# Patient Record
Sex: Male | Born: 1980 | Race: White | Hispanic: No | Marital: Single | Smoking: Current every day smoker
Health system: Southern US, Community
[De-identification: ages and names within clinical notes are randomized; demographics above are authoritative.]

## PROBLEM LIST (undated history)

## (undated) DIAGNOSIS — I10 Essential (primary) hypertension: Secondary | ICD-10-CM

## (undated) HISTORY — PX: HERNIA REPAIR: SHX51

---

## 2015-02-18 ENCOUNTER — Emergency Department (HOSPITAL_COMMUNITY): Payer: Self-pay

## 2015-02-18 ENCOUNTER — Encounter (HOSPITAL_COMMUNITY): Payer: Self-pay | Admitting: Emergency Medicine

## 2015-02-18 ENCOUNTER — Emergency Department (HOSPITAL_COMMUNITY)
Admission: EM | Admit: 2015-02-18 | Discharge: 2015-02-18 | Disposition: A | Discharge status: Home / Self Care | Payer: Self-pay | Attending: Emergency Medicine | Admitting: Emergency Medicine

## 2015-02-18 DIAGNOSIS — Z720 Tobacco use: Secondary | ICD-10-CM | POA: Insufficient documentation

## 2015-02-18 DIAGNOSIS — R1031 Right lower quadrant pain: Secondary | ICD-10-CM | POA: Insufficient documentation

## 2015-02-18 LAB — URINALYSIS, ROUTINE W REFLEX MICROSCOPIC
Bilirubin Urine: NEGATIVE
GLUCOSE, UA: NEGATIVE mg/dL
Hgb urine dipstick: NEGATIVE
Ketones, ur: NEGATIVE mg/dL
LEUKOCYTES UA: NEGATIVE
Nitrite: NEGATIVE
PH: 5.5 (ref 5.0–8.0)
Protein, ur: NEGATIVE mg/dL
Specific Gravity, Urine: 1.035 — ABNORMAL HIGH (ref 1.005–1.030)
UROBILINOGEN UA: 1 mg/dL (ref 0.0–1.0)

## 2015-02-18 LAB — I-STAT CHEM 8, ED
BUN: 16 mg/dL (ref 6–23)
CALCIUM ION: 1.15 mmol/L (ref 1.12–1.23)
CHLORIDE: 104 mmol/L (ref 96–112)
CREATININE: 0.9 mg/dL (ref 0.50–1.35)
Glucose, Bld: 115 mg/dL — ABNORMAL HIGH (ref 70–99)
HEMATOCRIT: 46 % (ref 39.0–52.0)
Hemoglobin: 15.6 g/dL (ref 13.0–17.0)
POTASSIUM: 3.6 mmol/L (ref 3.5–5.1)
Sodium: 140 mmol/L (ref 135–145)
TCO2: 20 mmol/L (ref 0–100)

## 2015-02-18 MED ORDER — HYDROCODONE-ACETAMINOPHEN 5-325 MG PO TABS
1.0000 | ORAL_TABLET | Freq: Four times a day (QID) | ORAL | Status: DC | PRN
Start: 2015-02-18 — End: 2015-06-28

## 2015-02-18 MED ORDER — NAPROXEN 500 MG PO TABS: 500.0000 mg | ORAL_TABLET | Freq: Two times a day (BID) | ORAL | Status: DC

## 2015-02-18 MED ORDER — IOHEXOL 300 MG/ML  SOLN
100.0000 mL | Freq: Once | INTRAMUSCULAR | Status: AC | PRN
  Administered 2015-02-18: 100 mL via INTRAVENOUS

## 2015-02-18 MED ORDER — MORPHINE SULFATE 4 MG/ML IJ SOLN
4.0000 mg | Freq: Once | INTRAMUSCULAR | Status: AC
  Administered 2015-02-18: 4 mg via INTRAVENOUS
  Filled 2015-02-18: qty 1

## 2015-02-18 MED ORDER — IOHEXOL 300 MG/ML  SOLN
25.0000 mL | Freq: Once | INTRAMUSCULAR | Status: AC | PRN
  Administered 2015-02-18: 25 mL via ORAL

## 2015-02-18 NOTE — ED Provider Notes (Signed)
CSN: [id number]     Arrival date & time 02/18/15  0801 History   First MD Initiated Contact with Patient 02/18/15 [phone]     Chief Complaint  Patient presents with  . Groin Pain     (Consider location/radiation/quality/duration/timing/severity/associated sxs/prior Treatment) Patient is a 34 y.o. male presenting with groin pain. The history is provided by the patient. No language interpreter was used.  Groin Pain This is a new problem. The current episode started 6 to 12 hours ago. The problem occurs constantly. The problem has not changed since onset.Pertinent negatives include no chest pain, no abdominal pain, no headaches and no shortness of breath. Exacerbated by: coughing, mvmt. Nothing relieves the symptoms. He has tried nothing for the symptoms. The treatment provided no relief.    History reviewed. No pertinent past medical history. Past Surgical History  Procedure Laterality Date  . Hernia repair     No family history on file. History  Substance Use Topics  . Smoking status: Current Every Day Smoker  . Smokeless tobacco: Not on file  . Alcohol Use: Yes    Review of Systems  Constitutional: Negative for fever, activity change, appetite change and fatigue.  HENT: Negative for congestion, facial swelling, rhinorrhea and trouble swallowing.   Eyes: Negative for photophobia and pain.  Respiratory: Negative for cough, chest tightness and shortness of breath.   Cardiovascular: Negative for chest pain and leg swelling.  Gastrointestinal: Negative for nausea, vomiting, abdominal pain, diarrhea and constipation.  Endocrine: Negative for polydipsia and polyuria.  Genitourinary: Negative for dysuria, urgency, decreased urine volume and difficulty urinating.  Musculoskeletal: Negative for back pain and gait problem.  Skin: Negative for color change, rash and wound.  Allergic/Immunologic: Negative for immunocompromised state.  Neurological: Negative for dizziness, facial asymmetry,  speech difficulty, weakness, numbness and headaches.  Psychiatric/Behavioral: Negative for confusion, decreased concentration and agitation.      Allergies  Review of patient's allergies indicates no known allergies.  Home Medications   Prior to Admission medications   Medication Sig Start Date End Date Taking? Authorizing Provider  ibuprofen (ADVIL,MOTRIN) 200 MG tablet Take 400 mg by mouth every 6 (six) hours as needed for fever, headache or mild pain.   Yes Historical Provider, MD  HYDROcodone-acetaminophen (NORCO) 5-325 MG per tablet Take 1 tablet by mouth every 6 (six) hours as needed. 02/18/15   Toy Cookey, MD  naproxen (NAPROSYN) 500 MG tablet Take 1 tablet (500 mg total) by mouth 2 (two) times daily with a meal. 02/18/15   Toy Cookey, MD   BP 134/88 mmHg  Pulse 76  Temp(Src) 97.5 F (36.4 C) (Oral)  Resp 18  SpO2 99% Physical Exam  Constitutional: He is oriented to person, place, and time. He appears well-developed and well-nourished. No distress.  HENT:  Head: Normocephalic and atraumatic.  Mouth/Throat: No oropharyngeal exudate.  Eyes: Pupils are equal, round, and reactive to light.  Neck: Normal range of motion. Neck supple.  Cardiovascular: Normal rate, regular rhythm and normal heart sounds.  Exam reveals no gallop and no friction rub.   No murmur heard. Pulmonary/Chest: Effort normal and breath sounds normal. No respiratory distress. He has no wheezes. He has no rales.  Abdominal: Soft. Bowel sounds are normal. He exhibits no distension and no mass. There is no tenderness. There is no rebound and no guarding. Hernia confirmed negative in the right inguinal area and confirmed negative in the left inguinal area.  Genitourinary:    Cremasteric reflex is present. Right testis shows  no mass, no swelling and no tenderness. Left testis shows tenderness (mild). Left testis shows no mass and no swelling.  Musculoskeletal: Normal range of motion. He exhibits no edema  or tenderness.  Lymphadenopathy:       Right: No inguinal adenopathy present.       Left: No inguinal adenopathy present.  Neurological: He is alert and oriented to person, place, and time.  Skin: Skin is warm and dry.  Psychiatric: He has a normal mood and affect.    ED Course  Procedures (including critical care time) Labs Review Labs Reviewed  URINALYSIS, ROUTINE W REFLEX MICROSCOPIC - Abnormal; Notable for the following:    Color, Urine AMBER (*)    Specific Gravity, Urine 1.035 (*)    All other components within normal limits  I-STAT CHEM 8, ED - Abnormal; Notable for the following:    Glucose, Bld 115 (*)    All other components within normal limits    Imaging Review Ct Abdomen Pelvis W Contrast  02/18/2015   CLINICAL DATA:  Left inguinal pain starting last ninth, states bilateral hernia repair 2015  EXAM: CT ABDOMEN AND PELVIS WITH CONTRAST  TECHNIQUE: Multidetector CT imaging of the abdomen and pelvis was performed using the standard protocol following bolus administration of intravenous contrast.  CONTRAST:  OMNIPAQUE IOHEXOL 300 MG/ML  SOLN  COMPARISON:  None.  FINDINGS: Lung bases are unremarkable. Sagittal images of the spine are unremarkable.  Enhanced liver is unremarkable. No calcified gallstones are noted within gallbladder. The pancreas, spleen and adrenal glands are unremarkable. Enhanced kidneys are symmetrical in size. There is a cyst in midpole of the right kidney measures 1.1 cm. Second cyst midpole of the right kidney measures 1.1 cm. Tiny cyst midpole of the left kidney measures 7 mm.  No aortic aneurysm. No small bowel obstruction. No ascites or free air. No adenopathy.  No pericecal inflammation. Normal retrocecal appendix. The terminal ileum is unremarkable. No thickened or dilated small bowel loops are noted. Tiny umbilical hernia containing fat without evidence of acute complication. There is a tiny right inguinal canal hernia containing fat measures 1.9 cm.  Small left inguinal canal hernia containing fat measures 2 cm. There is no evidence of acute complication. Borderline enlarged lymph node is noted in right inguinal region measures 1.5 x 1.2 cm.  IMPRESSION: 1. No acute inflammatory process within abdomen. 2. Normal appendix.  No pericecal inflammation. 3. No hydronephrosis or hydroureter.  Bilateral small renal cysts. 4. No small bowel obstruction. 5. Small amount of fat is noted bilateral inguinal canal without evidence of acute complication. No incarcerated hernia. Borderline enlarged right inguinal lymph node measures 1.4 x 1.2 cm.   Electronically Signed   By: Natasha Mead M.D.   On: 02/18/2015 10:34     EKG Interpretation None      MDM   Final diagnoses:  Right inguinal pain    Pt is a 34 y.o. male with Pmhx as above who presents with sudden onset L inguinal pain after coughing fit last night. On PE VSS, pt in NAD. He has reproducible pain over L inguinal area, but no palpable hernia. He has mild scrotal pain w/o swelling, nml lie, nml cremasteric.   UA normal.  CT abdomen pelvis with small amount of fat in the bilateral inguinal canal without acute complications incarcerated hernia, borderline enlarged right inguinal lymph node.  I suspect this intrusion of fat into the canals may be the cause of his sudden onset groin pain.  I will recommend outpatient follow-up with his surgeon.  I will give short course of narcotics and anti-inflammatories for pain.  I have recommended no heavy lifting for at least one week.   Dwyane DeeJeffrey Diesel evaluation in the Emergency Department is complete. It has been determined that no acute conditions requiring further emergency intervention are present at this time. The patient/guardian have been advised of the diagnosis and plan. We have discussed signs and symptoms that warrant return to the ED, such as changes or worsening in symptoms, worsening pain, fever, swelling that is not reducible.      Toy CookeyMegan  Docherty, MD 02/18/15 1114

## 2015-02-18 NOTE — ED Notes (Signed)
Pt reports hernia repair last year for bilateral inguinal hernias. Last night he coughed and since has had extreme pain to L groin.

## 2015-02-18 NOTE — Discharge Instructions (Signed)

## 2015-06-28 ENCOUNTER — Encounter (HOSPITAL_COMMUNITY): Payer: Self-pay | Admitting: *Deleted

## 2015-06-28 ENCOUNTER — Emergency Department (HOSPITAL_COMMUNITY)
Admission: EM | Admit: 2015-06-28 | Discharge: 2015-06-28 | Disposition: A | Discharge status: Home / Self Care | Payer: Self-pay | Attending: Emergency Medicine | Admitting: Emergency Medicine

## 2015-06-28 DIAGNOSIS — K4020 Bilateral inguinal hernia, without obstruction or gangrene, not specified as recurrent: Secondary | ICD-10-CM | POA: Insufficient documentation

## 2015-06-28 DIAGNOSIS — Z9889 Other specified postprocedural states: Secondary | ICD-10-CM | POA: Insufficient documentation

## 2015-06-28 DIAGNOSIS — Z720 Tobacco use: Secondary | ICD-10-CM | POA: Insufficient documentation

## 2015-06-28 DIAGNOSIS — Z791 Long term (current) use of non-steroidal anti-inflammatories (NSAID): Secondary | ICD-10-CM | POA: Insufficient documentation

## 2015-06-28 MED ORDER — HYDROCODONE-ACETAMINOPHEN 5-325 MG PO TABS
2.0000 | ORAL_TABLET | ORAL | Status: DC | PRN
Start: 2015-06-28 — End: 2015-07-27

## 2015-06-28 NOTE — ED Notes (Signed)
Patient reports having bilateral inguinal hernia repair x 1 year ago. Pain is back in both groins area, pain is constant. Patient reports heavy lifting as a part of his job.

## 2015-06-28 NOTE — ED Provider Notes (Signed)
CSN: [id number]     Arrival date & time 06/28/15  1758 History   First MD Initiated Contact with Patient 06/28/15 1931     Chief Complaint  Patient presents with  . Groin Pain      HPI  She presents I am relation of bilateral groin pain. Had bilateral hernia repairs done at [facility] over a year ago. He does a lot of heavy lifting at his job. 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 His been increasing left groin, and now right groin pain for the last several days. No frank bulge. No pain or symptoms to US scrotum.  History reviewed. No pertinent past medical history. Past Surgical History  Procedure Laterality Date  . Hernia repair     No family history on file. History  Substance Use Topics  . Smoking status: Current Every Day Smoker -- 1.00 packs/day    Types: Cigarettes  . Smokeless tobacco: Not on file  . Alcohol Use: Yes    Review of Systems  Constitutional: Negative for fever, chills, diaphoresis, appetite change and fatigue.  HENT: Negative for mouth sores, sore throat and trouble swallowing.   Eyes: Negative for visual disturbance.  Respiratory: Negative for cough, chest tightness, shortness of breath and wheezing.   Cardiovascular: Negative for chest pain.  Gastrointestinal: Negative for nausea, vomiting, abdominal pain, diarrhea and abdominal distention.  Endocrine: Negative for polydipsia, polyphagia and polyuria.  Genitourinary: Negative for dysuria, frequency and hematuria.       Groin pain  Musculoskeletal: Negative for gait problem.  Skin: Negative for color change, pallor and rash.  Neurological: Negative for dizziness, syncope, light-headedness and headaches.  Hematological: Does not bruise/bleed easily.  Psychiatric/Behavioral: Negative for behavioral problems and confusion.      Allergies  Review of patient's allergies indicates no known allergies.  Home Medications   Prior to  Admission medications   Medication Sig Start Date End Date Taking? Authorizing Provider  HYDROcodone-acetaminophen (NORCO/VICODIN) 5-325 MG per tablet Take 2 tablets by mouth every 4 (four) hours as needed. 06/28/15   Rolland Porter, MD  ibuprofen (ADVIL,MOTRIN) 200 MG tablet Take 400 mg by mouth every 6 (six) hours as needed for fever, headache or mild pain.    Historical Provider, MD  naproxen (NAPROSYN) 500 MG tablet Take 1 tablet (500 mg total) by mouth 2 (two) times daily with a meal. 02/18/15   Toy Cookey, MD   BP 162/97 mmHg  Pulse 84  Temp(Src) 98.2 F (36.8 C) (Oral)  Resp 20  Ht  (1.981 m)  Wt 194 lb 7 oz (88.196 kg)  BMI 22.47 kg/m2  SpO2 96% Physical Exam  Constitutional: He is oriented to person, place, and time. He appears well-developed and well-nourished. No distress.  HENT:  Head: Normocephalic.  Eyes: Conjunctivae are normal. Pupils are equal, round, and reactive to light. No scleral icterus.  Neck: Normal range of motion. Neck supple. No thyromegaly present.  Cardiovascular: Normal rate and regular rhythm.  Exam reveals no gallop and no friction rub.   No murmur heard. Pulmonary/Chest: Effort normal and breath sounds normal. No respiratory distress. He has no wheezes. He has no rales.  Abdominal: Soft. Bowel sounds are normal. He exhibits no distension. There is no tenderness. There is no rebound.    Musculoskeletal: Normal range of motion.  Neurological: He is alert and oriented to person, place, and time.  Skin: Skin is warm and dry. No rash noted.  Psychiatric: He has a normal mood and affect. His behavior is normal.    ED Course  Procedures (including critical care time) Labs Review Labs Reviewed - No data to display  Imaging Review No results found.   EKG Interpretation None      MDM   Final diagnoses:  Bilateral inguinal hernia without obstruction or gangrene, recurrence not specified    Patient with palpable defect left inguinal region.  Recent CT in June shows fatty recurrence of his inguinal hernias bilaterally. Left greater than right. Avoid repetitive motion heavy lifting. Surgical referral.    Rolland PorterMark Brittnei Jagiello, MD 06/28/15 2212

## 2015-06-28 NOTE — Discharge Instructions (Signed)

## 2015-07-26 ENCOUNTER — Encounter (HOSPITAL_COMMUNITY): Payer: Self-pay | Admitting: Emergency Medicine

## 2015-07-26 ENCOUNTER — Emergency Department (HOSPITAL_COMMUNITY): Payer: Self-pay

## 2015-07-26 ENCOUNTER — Emergency Department (HOSPITAL_COMMUNITY)
Admission: EM | Admit: 2015-07-26 | Discharge: 2015-07-27 | Disposition: A | Discharge status: Home / Self Care | Payer: Self-pay | Attending: Emergency Medicine | Admitting: Emergency Medicine

## 2015-07-26 DIAGNOSIS — R339 Retention of urine, unspecified: Secondary | ICD-10-CM | POA: Insufficient documentation

## 2015-07-26 DIAGNOSIS — K5900 Constipation, unspecified: Secondary | ICD-10-CM | POA: Insufficient documentation

## 2015-07-26 DIAGNOSIS — R109 Unspecified abdominal pain: Secondary | ICD-10-CM | POA: Insufficient documentation

## 2015-07-26 DIAGNOSIS — I10 Essential (primary) hypertension: Secondary | ICD-10-CM | POA: Insufficient documentation

## 2015-07-26 DIAGNOSIS — Z720 Tobacco use: Secondary | ICD-10-CM | POA: Insufficient documentation

## 2015-07-26 DIAGNOSIS — R52 Pain, unspecified: Secondary | ICD-10-CM

## 2015-07-26 HISTORY — DX: Essential (primary) hypertension: I10

## 2015-07-26 LAB — COMPREHENSIVE METABOLIC PANEL
ALT: 21 U/L (ref 17–63)
ANION GAP: 10 (ref 5–15)
AST: 23 U/L (ref 15–41)
Albumin: 4.3 g/dL (ref 3.5–5.0)
Alkaline Phosphatase: 66 U/L (ref 38–126)
BILIRUBIN TOTAL: 0.5 mg/dL (ref 0.3–1.2)
BUN: 13 mg/dL (ref 6–20)
CHLORIDE: 105 mmol/L (ref 101–111)
CO2: 24 mmol/L (ref 22–32)
Calcium: 8.7 mg/dL — ABNORMAL LOW (ref 8.9–10.3)
Creatinine, Ser: 0.95 mg/dL (ref 0.61–1.24)
Glucose, Bld: 92 mg/dL (ref 65–99)
POTASSIUM: 3.3 mmol/L — AB (ref 3.5–5.1)
Sodium: 139 mmol/L (ref 135–145)
TOTAL PROTEIN: 7.3 g/dL (ref 6.5–8.1)

## 2015-07-26 LAB — CBC WITH DIFFERENTIAL/PLATELET
BASOS ABS: 0 10*3/uL (ref 0.0–0.1)
Basophils Relative: 0 % (ref 0–1)
EOS PCT: 5 % (ref 0–5)
Eosinophils Absolute: 0.4 10*3/uL (ref 0.0–0.7)
HCT: 43.2 % (ref 39.0–52.0)
Hemoglobin: 14.7 g/dL (ref 13.0–17.0)
Lymphocytes Relative: 35 % (ref 12–46)
Lymphs Abs: 2.8 10*3/uL (ref 0.7–4.0)
MCH: 30.4 pg (ref 26.0–34.0)
MCHC: 34 g/dL (ref 30.0–36.0)
MCV: 89.3 fL (ref 78.0–100.0)
MONOS PCT: 8 % (ref 3–12)
Monocytes Absolute: 0.6 10*3/uL (ref 0.1–1.0)
Neutro Abs: 4.1 10*3/uL (ref 1.7–7.7)
Neutrophils Relative %: 52 % (ref 43–77)
PLATELETS: 306 10*3/uL (ref 150–400)
RBC: 4.84 MIL/uL (ref 4.22–5.81)
RDW: 13.7 % (ref 11.5–15.5)
WBC: 8 10*3/uL (ref 4.0–10.5)

## 2015-07-26 LAB — URINALYSIS, ROUTINE W REFLEX MICROSCOPIC
Bilirubin Urine: NEGATIVE
GLUCOSE, UA: NEGATIVE mg/dL
Hgb urine dipstick: NEGATIVE
KETONES UR: NEGATIVE mg/dL
Leukocytes, UA: NEGATIVE
NITRITE: NEGATIVE
PROTEIN: NEGATIVE mg/dL
Specific Gravity, Urine: 1.025 (ref 1.005–1.030)
Urobilinogen, UA: 0.2 mg/dL (ref 0.0–1.0)
pH: 6 (ref 5.0–8.0)

## 2015-07-26 NOTE — ED Provider Notes (Signed)
CSN: [id number]     Arrival date & time 07/26/15  1718 History  This chart was scribed for Nelva Nay, MD by Budd Palmer, ED Scribe. This patient was seen in room APA11/AP[facility]11 and the patient's care was started at 8:30 PM.     Chief Complaint  Patient presents with  . Abdominal Pain   The history is provided by the patient. No language interpreter was used.   HPI Comments: Jeff Miller is a 34 y.o. male with a PSHx of bilateral inguinal hernia repair who presents to the Emergency Department complaining of constant, worsening lower abdominal pain onset 7 days ago. He reports associated urinary retention and constipation. He was seen for the same at the ED 3 weeks ago where he was told to "take it easy" and take prescribed pain medication as needed. He reports he has been doing just that, but 7 days ago the pain began to worsen and his other symptoms began. His hernia repair surgery was over a year ago, performed by Dr Claudia Desanctis. He has consulted with a surgeon in [place] since the pain began again 3 weeks ago, and reports the surgeon will be able to see him in September, but he has not yet made an appointment.  Past Medical History  Diagnosis Date  . Hypertension    Past Surgical History  Procedure Laterality Date  . Hernia repair     No family history on file. Social History  Substance Use Topics  . Smoking status: Current Every Day Smoker -- 1.00 packs/day    Types: Cigarettes  . Smokeless tobacco: None  . Alcohol Use: Yes     Comment: rare    Review of Systems  Gastrointestinal: Positive for abdominal pain and constipation.  Genitourinary: Positive for decreased urine volume.  All other systems reviewed and are negative.   Allergies  Review of patient's allergies indicates no known allergies.  Home Medications   Prior to Admission medications   Medication Sig Start Date End Date Taking? Authorizing Provider  ibuprofen (ADVIL,MOTRIN) 200 MG tablet Take 400 mg by mouth  every 6 (six) hours as needed for fever, headache or mild pain.   Yes Historical Provider, MD  polyethylene glycol (MIRALAX) packet Take 17 g by mouth daily. 07/27/15   Nelva Nay, MD   BP 141/93 mmHg  Pulse 64  Temp(Src) 97.5 F (36.4 C) (Oral)  Resp 18  Ht 5' 6.5" (1.689 m)  Wt 194 lb (87.998 kg)  BMI 30.85 kg/m2  SpO2 100% Physical Exam  Constitutional: He is oriented to person, place, and time. He appears well-developed and well-nourished. No distress.  HENT:  Head: Normocephalic and atraumatic.  Eyes: Pupils are equal, round, and reactive to light.  Neck: Normal range of motion.  Cardiovascular: Normal rate and intact distal pulses.   Pulmonary/Chest: No respiratory distress.  Abdominal: Normal appearance. He exhibits no distension. There is no tenderness. No hernia. Hernia confirmed negative in the right inguinal area and confirmed negative in the left inguinal area.  Musculoskeletal: Normal range of motion.  Neurological: He is alert and oriented to person, place, and time. No cranial nerve deficit.  Skin: Skin is warm and dry. No rash noted.  Psychiatric: He has a normal mood and affect. His behavior is normal.  Nursing note and vitals reviewed.   ED Course  Procedures  DIAGNOSTIC STUDIES: Oxygen Saturation is 99% on RA, normal by my interpretation.    COORDINATION OF CARE: 8:34 PM - Discussed plans to order diagnostic imaging.  Pt advised of plan for treatment and pt agrees.  Labs Review Labs Reviewed  COMPREHENSIVE METABOLIC PANEL - Abnormal; Notable for the following:    Potassium 3.3 (*)    Calcium 8.7 (*)    All other components within normal limits  CBC WITH DIFFERENTIAL/PLATELET  URINALYSIS, ROUTINE W REFLEX MICROSCOPIC (NOT AT [facility])    Imaging Review No results found. I, Haskel Dewalt L, personally reviewed and evaluated these images and lab results as part of my medical decision-making. CT RENAL STONE STUDY (Final result) Result time: 07/26/15  23:18:30   Final result by Rad Results In Interface (07/26/15 23:18:30)   Narrative:   CLINICAL DATA: Acute onset of worsening lower abdominal pain and urinary retention. Constipation. Initial encounter.  EXAM: CT ABDOMEN AND PELVIS WITHOUT CONTRAST  TECHNIQUE: Multidetector CT imaging of the abdomen and pelvis was performed following the standard protocol without IV contrast.  COMPARISON: CT of the abdomen and pelvis performed 02/18/2015  FINDINGS: The visualized lung bases are clear.  The liver and spleen are unremarkable in appearance. The gallbladder is within normal limits. The pancreas and adrenal glands are unremarkable.  The kidneys are unremarkable in appearance. There is no evidence of hydronephrosis. No renal or ureteral stones are seen. No perinephric stranding is appreciated.  No free fluid is identified. The small bowel is unremarkable in appearance. The stomach is within normal limits. No acute vascular abnormalities are seen. Minimal calcification is noted along the abdominal aorta.  The appendix is normal in caliber and contains air, without evidence of appendicitis. The colon is unremarkable in appearance.  The bladder is mildly distended and grossly unremarkable. The prostate remains normal in size. No inguinal lymphadenopathy is seen.  No acute osseous abnormalities are identified.  IMPRESSION: Unremarkable noncontrast CT of the abdomen and pelvis.       MDM   Final diagnoses:  Pain   I personally performed the services described in this documentation, which was scribed in my presence. The recorded information has been reviewed and considered.   Nelva Nay, MD 08/07/15 [phone]

## 2015-07-26 NOTE — ED Notes (Signed)
Pt c/o lower abd pain with constipation x 7 days. Pt states he has reoccurring inguinal hernias.

## 2015-07-27 MED ORDER — HYDROMORPHONE HCL 1 MG/ML IJ SOLN: 1.0000 mg | Freq: Once | INTRAMUSCULAR | Status: DC

## 2015-07-27 MED ORDER — POLYETHYLENE GLYCOL 3350 17 G PO PACK: 17.0000 g | PACK | Freq: Every day | ORAL | Status: AC

## 2015-07-27 MED ORDER — HYDROMORPHONE HCL 1 MG/ML IJ SOLN
1.0000 mg | Freq: Once | INTRAMUSCULAR | Status: AC
  Administered 2015-07-27: 1 mg via INTRAVENOUS
  Filled 2015-07-27: qty 1

## 2015-07-27 NOTE — Discharge Instructions (Signed)

## 2016-03-26 IMAGING — CT CT ABD-PELV W/ CM
2 of 4 series · 16 of 46 positions shown, 18 images · IV contrast (APPLIED)
Comparison: None.

CLINICAL DATA: Left inguinal pain starting last ninth, states
bilateral hernia repair 5486

EXAM:
CT ABDOMEN AND PELVIS WITH CONTRAST
TECHNIQUE: Multidetector CT imaging of the abdomen and pelvis was performed
using the standard protocol following bolus administration of
intravenous contrast.
CONTRAST:  100mL OMNIPAQUE IOHEXOL 300 MG/ML  SOLN

[Series 2: abd/ pelvis 5.0 i30f 1 · axial · 0.86mm/px · z∈[+699,+1189]mm · 13 of 108 slices shown, 15 images]
[im 5/108  soft-tissue]
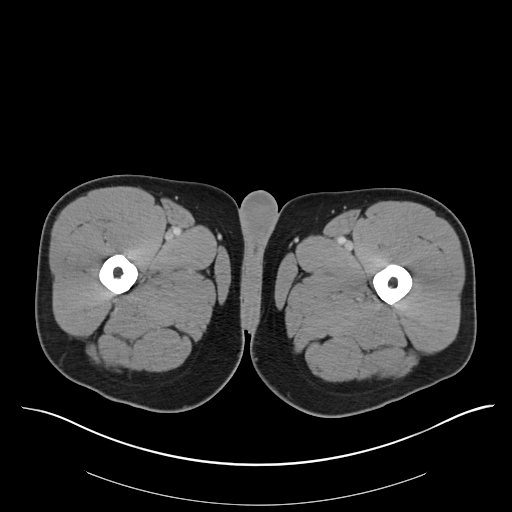
[im 5/108  bone]
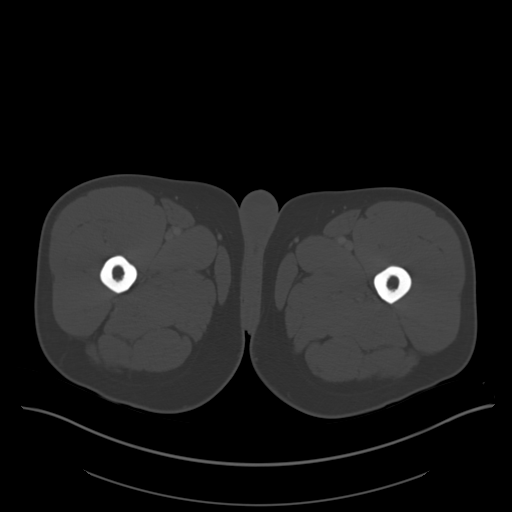
[im 14/108  soft-tissue]
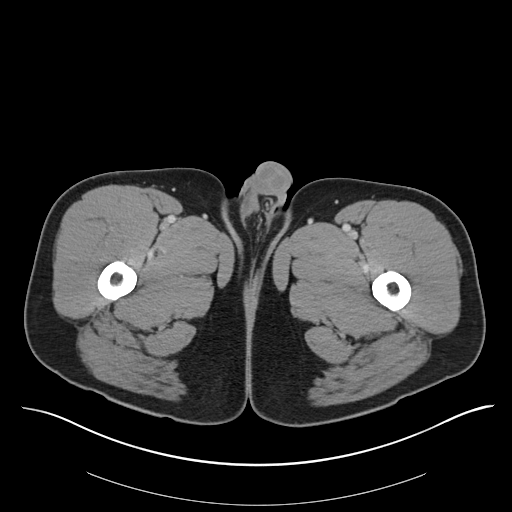
[im 23/108  soft-tissue]
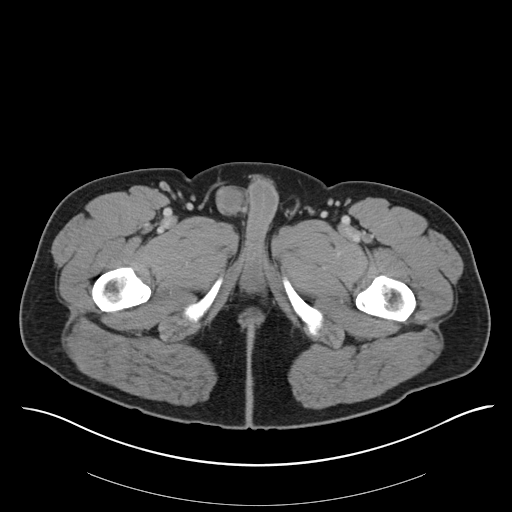
[im 32/108  soft-tissue]
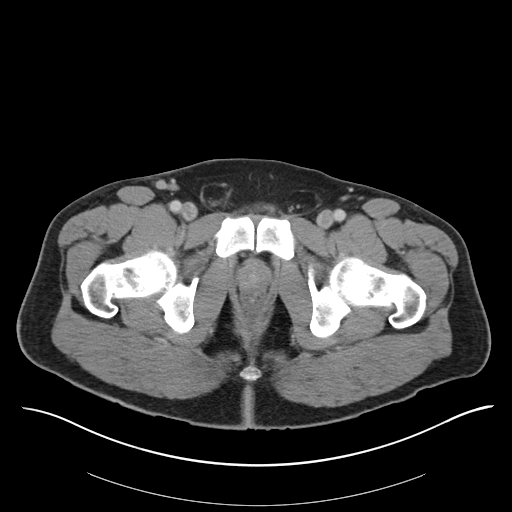
[im 36/108  soft-tissue]
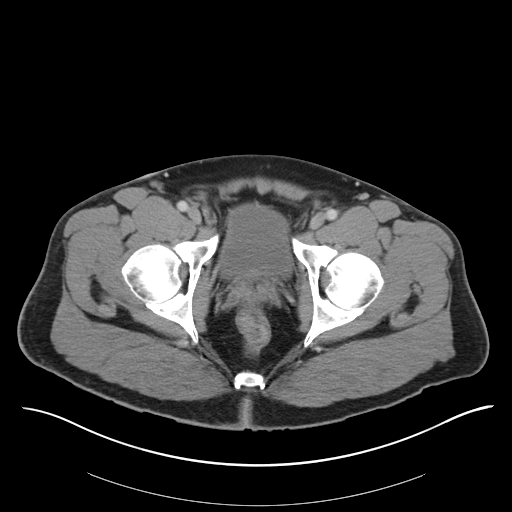
[im 45/108  soft-tissue]
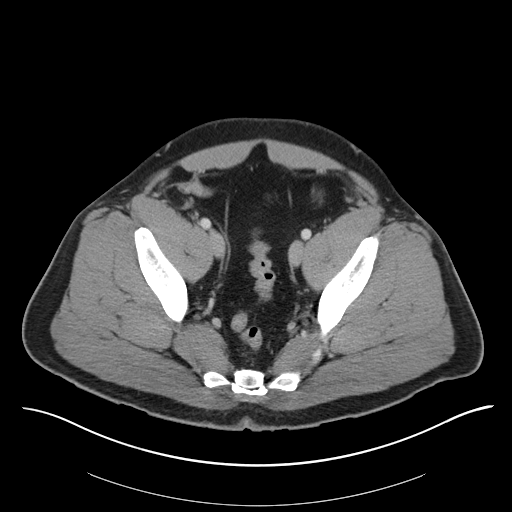
[im 54/108  soft-tissue]
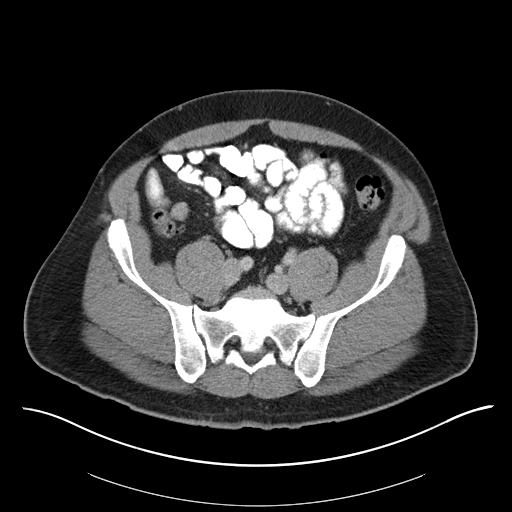
[im 63/108  soft-tissue]
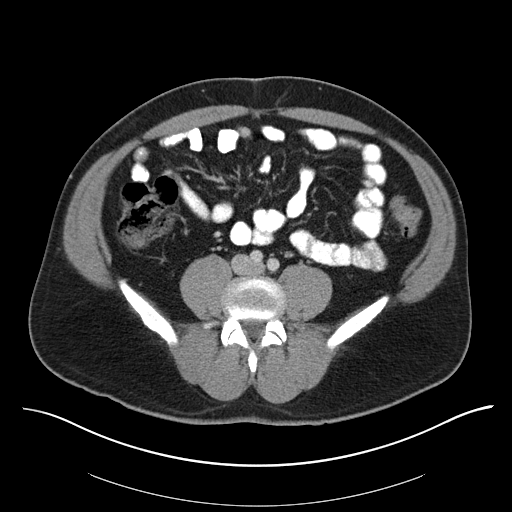
[im 72/108  soft-tissue]
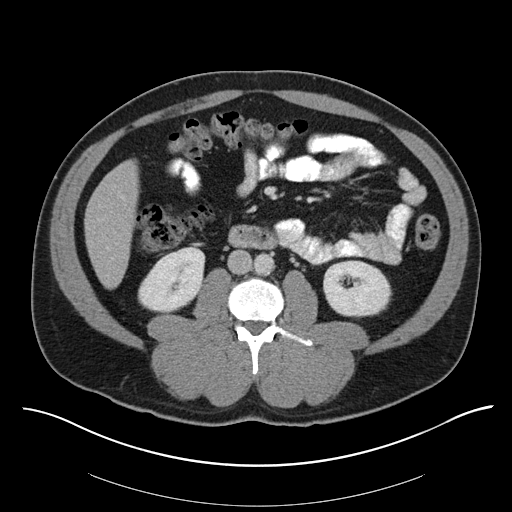
[im 72/108  bone]
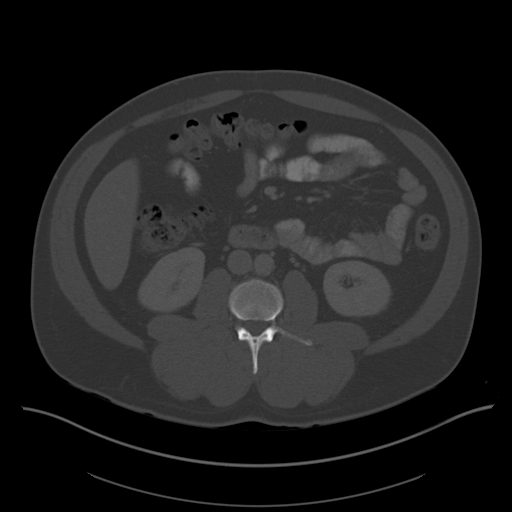
[im 76/108  soft-tissue]
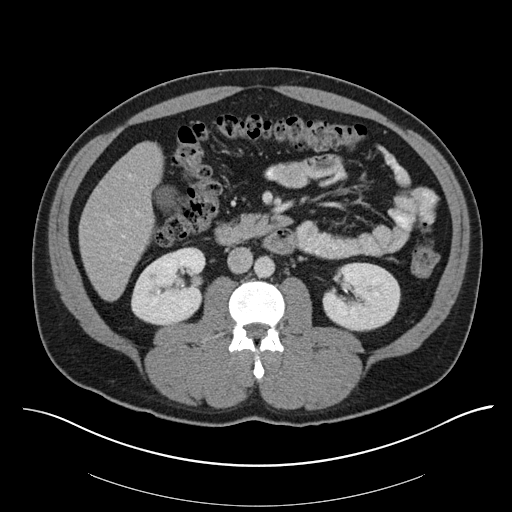
[im 85/108  soft-tissue]
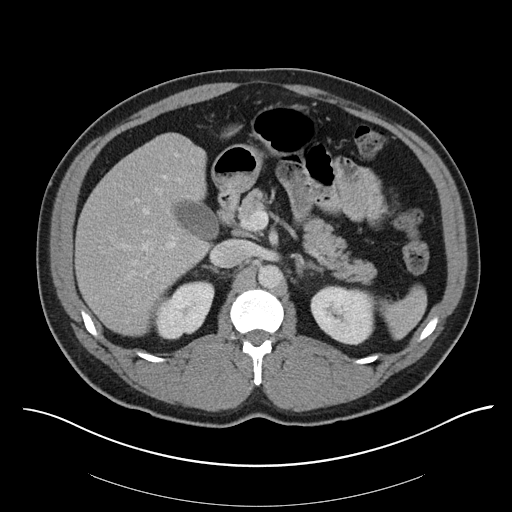
[im 94/108  soft-tissue]
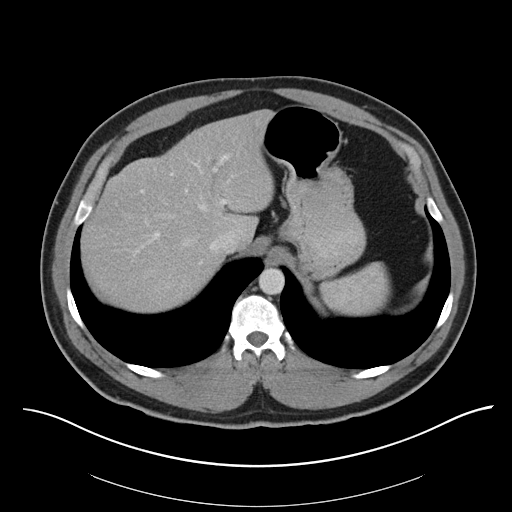
[im 103/108  soft-tissue]
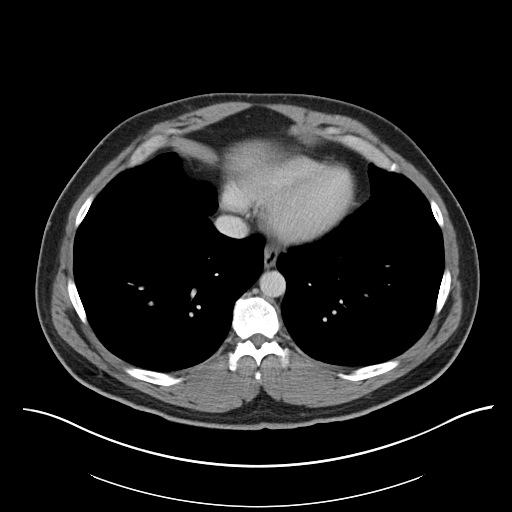

[Series 5: coronal soft tissue · coronal · 0.84mm/px · 3 of 99 slices shown]
[im 33/99  soft-tissue]
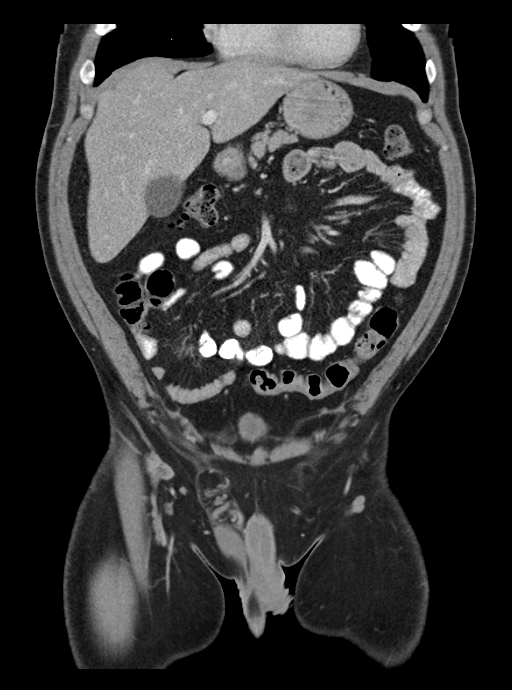
[im 44/99  soft-tissue]
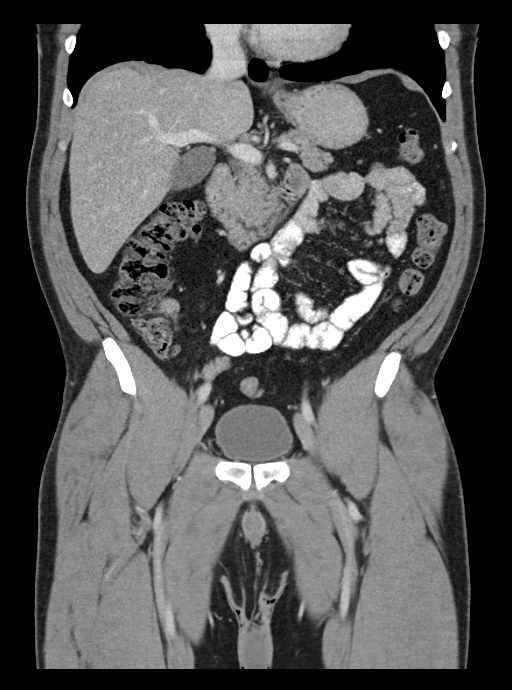
[im 55/99  soft-tissue]
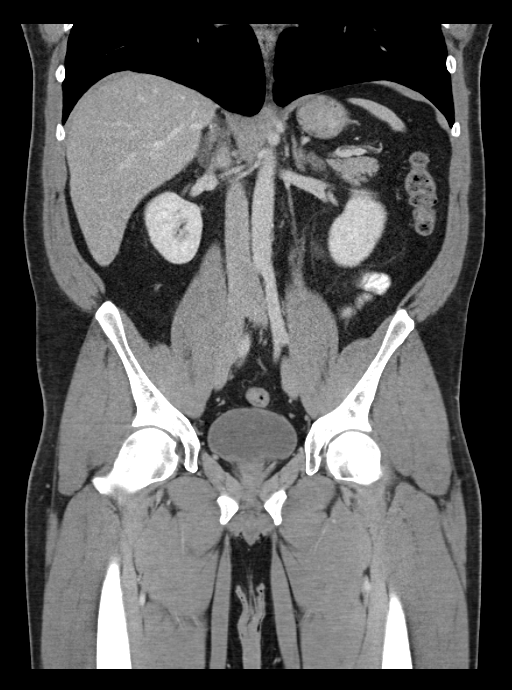

[16 of 46 positions shown; findings below may reference images not displayed]

FINDINGS: Lung bases are unremarkable. Sagittal images of the spine are
unremarkable.

Enhanced liver is unremarkable. No calcified gallstones are noted
within gallbladder. The pancreas, spleen and adrenal glands are
unremarkable. Enhanced kidneys are symmetrical in size. There is a
cyst in midpole of the right kidney measures 1.1 cm. Second cyst
midpole of the right kidney measures 1.1 cm. Tiny cyst midpole of
the left kidney measures 7 mm.

No aortic aneurysm. No small bowel obstruction. No ascites or free
air. No adenopathy.

No pericecal inflammation. Normal retrocecal appendix. The terminal
ileum is unremarkable. No thickened or dilated small bowel loops are
noted. Tiny umbilical hernia containing fat without evidence of
acute complication. There is a tiny right inguinal canal hernia
containing fat measures 1.9 cm. Small left inguinal canal hernia
containing fat measures 2 cm. There is no evidence of acute
complication. Borderline enlarged lymph node is noted in right
inguinal region measures 1.5 x 1.2 cm.
IMPRESSION: 1. No acute inflammatory process within abdomen.
2. Normal appendix.  No pericecal inflammation.
3. No hydronephrosis or hydroureter.  Bilateral small renal cysts.
4. No small bowel obstruction.
5. Small amount of fat is noted bilateral inguinal canal without
evidence of acute complication. No incarcerated hernia. Borderline
enlarged right inguinal lymph node measures 1.4 x 1.2 cm.

## 2016-08-31 IMAGING — DX DG ABDOMEN 1V
1 series · 1 of 1 positions shown · non-contrast
Comparison: CT, 02/18/2015

CLINICAL DATA: ABDOMINAL PAIN,

EXAM:
ABDOMEN - 1 VIEW

[abdomen supine]
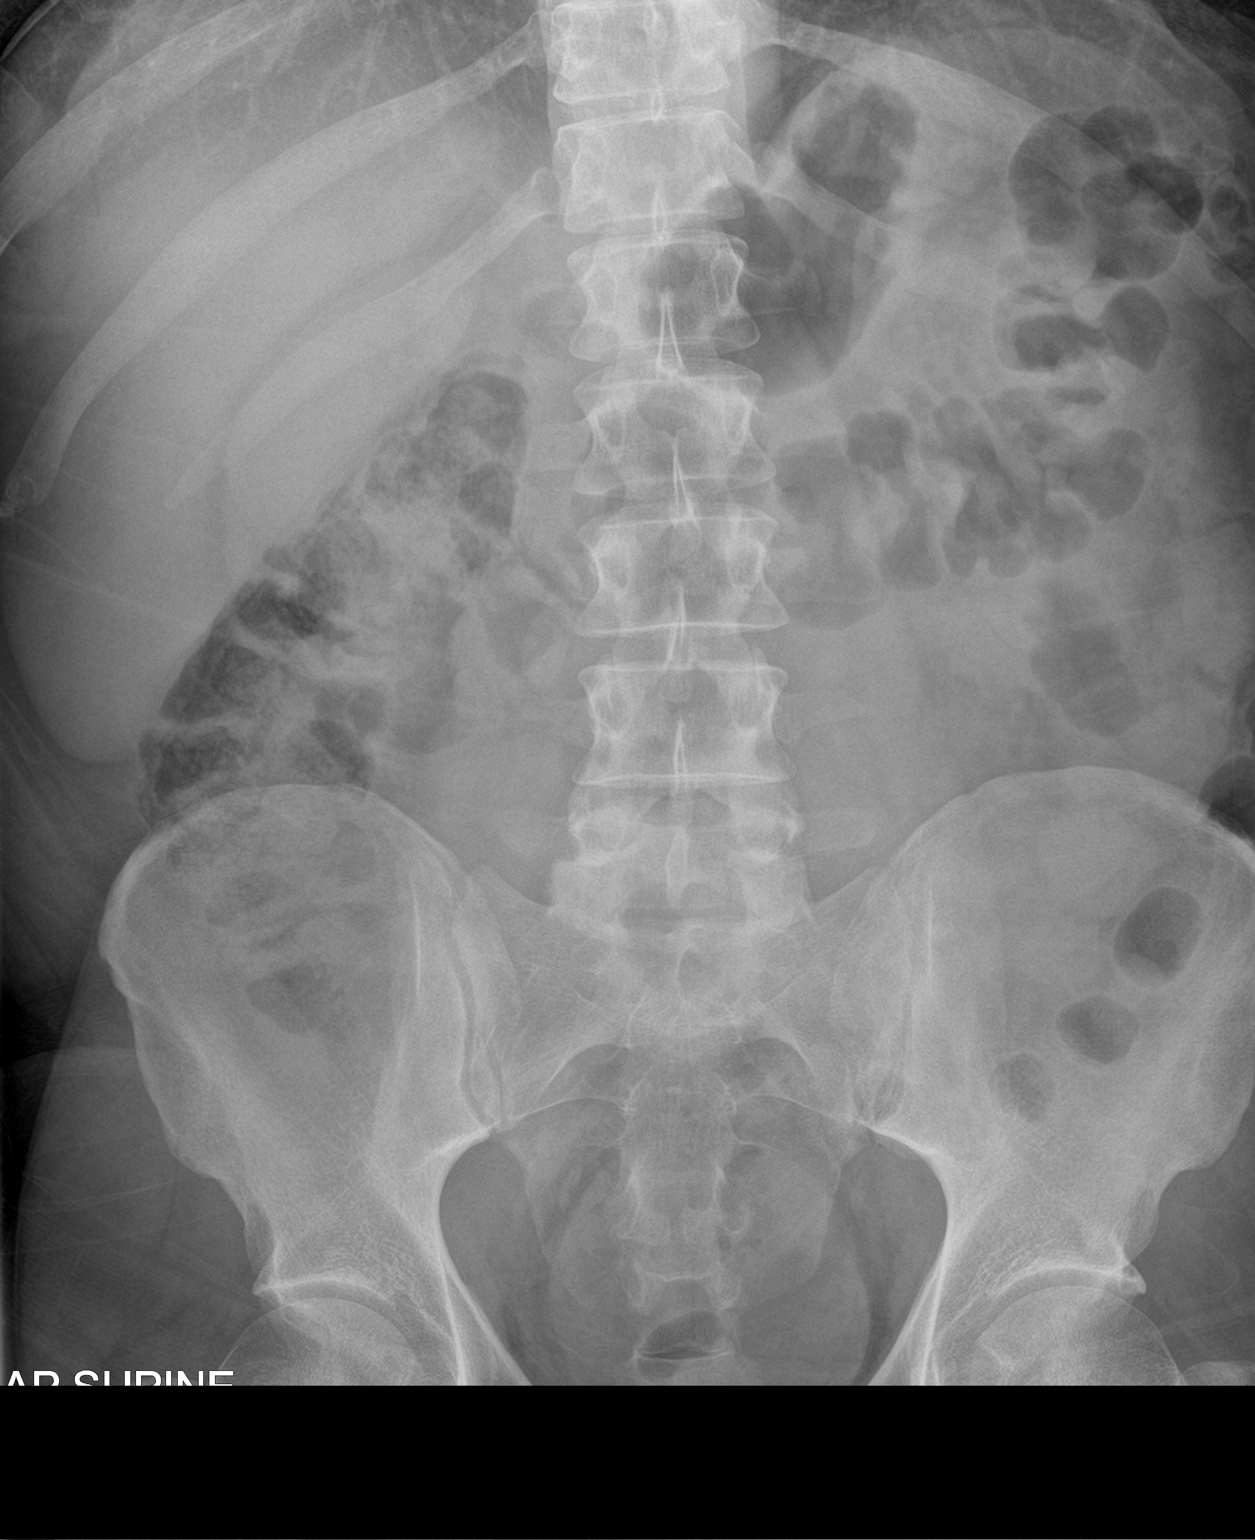

[1 of 1 positions shown; findings below may reference images not displayed]

FINDINGS: Normal bowel gas pattern. No evidence of obstruction or generalized
adynamic ileus. No significant increased stool in the colon.

No evidence of renal or ureteral stones. Soft tissues are
unremarkable.

Skeletal structures are unremarkable.
IMPRESSION: Negative.
